# Patient Record
Sex: Male | Born: 1970 | Race: Black or African American | Hispanic: No | Smoking: Current every day smoker
Health system: Southern US, Community
[De-identification: ages and names within clinical notes are randomized; demographics above are authoritative.]

## PROBLEM LIST (undated history)

## (undated) DIAGNOSIS — E7800 Pure hypercholesterolemia, unspecified: Secondary | ICD-10-CM

## (undated) DIAGNOSIS — E119 Type 2 diabetes mellitus without complications: Secondary | ICD-10-CM

## (undated) DIAGNOSIS — I10 Essential (primary) hypertension: Secondary | ICD-10-CM

---

## 2007-09-29 ENCOUNTER — Emergency Department (HOSPITAL_COMMUNITY): Admission: EM | Admit: 2007-09-29 | Discharge: 2007-09-30 | Payer: Self-pay | Admitting: Emergency Medicine

## 2011-04-10 LAB — GC/CHLAMYDIA PROBE AMP, GENITAL: GC Probe Amp, Genital: NEGATIVE

## 2014-11-09 ENCOUNTER — Emergency Department (HOSPITAL_COMMUNITY): Payer: 59

## 2014-11-09 ENCOUNTER — Encounter (HOSPITAL_COMMUNITY): Payer: Self-pay | Admitting: *Deleted

## 2014-11-09 ENCOUNTER — Emergency Department (HOSPITAL_COMMUNITY)
Admission: EM | Admit: 2014-11-09 | Discharge: 2014-11-09 | Disposition: A | Discharge status: Home / Self Care | Payer: 59 | Attending: Emergency Medicine | Admitting: Emergency Medicine

## 2014-11-09 DIAGNOSIS — E119 Type 2 diabetes mellitus without complications: Secondary | ICD-10-CM | POA: Diagnosis not present

## 2014-11-09 DIAGNOSIS — Z79899 Other long term (current) drug therapy: Secondary | ICD-10-CM | POA: Insufficient documentation

## 2014-11-09 DIAGNOSIS — R0789 Other chest pain: Secondary | ICD-10-CM

## 2014-11-09 DIAGNOSIS — Z7982 Long term (current) use of aspirin: Secondary | ICD-10-CM | POA: Diagnosis not present

## 2014-11-09 DIAGNOSIS — M79622 Pain in left upper arm: Secondary | ICD-10-CM | POA: Insufficient documentation

## 2014-11-09 DIAGNOSIS — I10 Essential (primary) hypertension: Secondary | ICD-10-CM | POA: Diagnosis not present

## 2014-11-09 DIAGNOSIS — Z720 Tobacco use: Secondary | ICD-10-CM | POA: Diagnosis not present

## 2014-11-09 DIAGNOSIS — R079 Chest pain, unspecified: Secondary | ICD-10-CM | POA: Diagnosis present

## 2014-11-09 HISTORY — DX: Essential (primary) hypertension: I10

## 2014-11-09 HISTORY — DX: Pure hypercholesterolemia, unspecified: E78.00

## 2014-11-09 HISTORY — DX: Type 2 diabetes mellitus without complications: E11.9

## 2014-11-09 LAB — BASIC METABOLIC PANEL
Anion gap: 8 (ref 5–15)
BUN: 16 mg/dL (ref 6–23)
CHLORIDE: 104 mmol/L (ref 96–112)
CO2: 25 mmol/L (ref 19–32)
CREATININE: 0.98 mg/dL (ref 0.50–1.35)
Calcium: 9.2 mg/dL (ref 8.4–10.5)
Glucose, Bld: 102 mg/dL — ABNORMAL HIGH (ref 70–99)
POTASSIUM: 3.8 mmol/L (ref 3.5–5.1)
SODIUM: 137 mmol/L (ref 135–145)

## 2014-11-09 LAB — CBC
HCT: 43.5 % (ref 39.0–52.0)
HEMOGLOBIN: 14.7 g/dL (ref 13.0–17.0)
MCH: 27.3 pg (ref 26.0–34.0)
MCHC: 33.8 g/dL (ref 30.0–36.0)
MCV: 80.9 fL (ref 78.0–100.0)
PLATELETS: 183 10*3/uL (ref 150–400)
RBC: 5.38 MIL/uL (ref 4.22–5.81)
RDW: 13.4 % (ref 11.5–15.5)
WBC: 3.9 10*3/uL — ABNORMAL LOW (ref 4.0–10.5)

## 2014-11-09 LAB — I-STAT TROPONIN, ED
TROPONIN I, POC: 0 ng/mL (ref 0.00–0.08)
Troponin i, poc: 0 ng/mL (ref 0.00–0.08)

## 2014-11-09 NOTE — ED Notes (Signed)
Pt has multiple complaints. Reports onset yesterday while eating, "had funny sensation go over his body." specifically had neck and chest pains, difficulty swallowing, abd "twitching", palpitations. No acute distress noted at triage.

## 2014-11-09 NOTE — ED Provider Notes (Signed)
CSN: [id number]     Arrival date & time 11/09/14  1210 History   First MD Initiated Contact with Patient 11/09/14 1636     Chief Complaint  Patient presents with  . Palpitations  . Chest Pain  . Headache     (Consider location/radiation/quality/duration/timing/severity/associated sxs/prior Treatment) HPI   44 year old male with history of non-insulin-dependent diabetes, hypertension, hypercholesterolemia who presents with planes of left chest pressure. Patient reports within the past year he has multiple episodes of pressure sensation radiates across his left chest up towards his neck and across his chest. Symptoms usually lasting 10-15 minutes, resolved without any specific treatment. He also reported mild headache and a funny sensation across his head along with heart palpitation during these episodes. He denies having pain, chest pressure and tightness. These episodes are nonexertional in nature. There is no associated fever, chills, productive cough, lightheadedness, dizziness, shortness of breath, hemoptysis, back pain, numbness or weakness. Last episode was last night while sitting. He reported being seen for this complaint last year which he had a negative stress test. He was also been evaluated by his PCP last month for the same complaint. At that time he was taking a statin medication and his doctor stopped his medication for a month. At this time he denies having any significant discomfort. Patient does work in [employer] but denies any recent heavy lifting or any recent trauma. No other specific complaint. He is a smoker and does have family history of cardiac disease.  Past Medical History  Diagnosis Date  . High cholesterol   . Hypertension   . Diabetes mellitus without complication    History reviewed. No pertinent past surgical history. History reviewed. No pertinent family history. History  Substance Use Topics  . Smoking status: Current Every Day Smoker    Types:  Cigarettes  . Smokeless tobacco: Not on file  . Alcohol Use: No    Review of Systems  All other systems reviewed and are negative.     Allergies  Review of patient's allergies indicates no known allergies.  Home Medications   Prior to Admission medications   Medication Sig Start Date End Date Taking? Authorizing Provider  aspirin EC 81 MG tablet Take 81 mg by mouth daily.   Yes Historical Provider, MD  ibuprofen (ADVIL,MOTRIN) 200 MG tablet Take 400 mg by mouth every 6 (six) hours as needed for headache or mild pain.   Yes Historical Provider, MD  losartan (COZAAR) 50 MG tablet Take 50 mg by mouth daily.   Yes Historical Provider, MD  metFORMIN (GLUCOPHAGE) 1000 MG tablet Take 1,000 mg by mouth 2 (two) times daily with a meal.   Yes Historical Provider, MD  metoprolol tartrate (LOPRESSOR) 25 MG tablet Take 25 mg by mouth 2 (two) times daily.   Yes Historical Provider, MD   BP 136/92 mmHg  Pulse 61  Temp(Src) 97.9 F (36.6 C) (Oral)  Resp 16  Ht 5\' 9"  (1.753 m)  Wt 235 lb 14.4 oz (107.004 kg)  BMI 34.82 kg/m2  SpO2 99% Physical Exam  Constitutional: He is oriented to person, place, and time. He appears well-developed and well-nourished. No distress.  HENT:  Head: Atraumatic.  Eyes: Conjunctivae are normal.  Neck: Normal range of motion. Neck supple. No JVD present.  No cervical midline spine tenderness crepitus or step-off.  Cardiovascular: Normal rate, regular rhythm and intact distal pulses.  Exam reveals no gallop and no friction rub.   No murmur heard. Pulmonary/Chest: Effort normal and breath sounds  normal. No respiratory distress. He has no wheezes. He exhibits no tenderness.  Abdominal: Soft. There is no tenderness.  Musculoskeletal: He exhibits no edema.  5 out 5 strength to bilateral upper extremities with normal pulses and normal sensation.  Neurological: He is alert and oriented to person, place, and time.  Skin: No rash noted.  Psychiatric: He has a normal  mood and affect.  Nursing note and vitals reviewed.   ED Course  Procedures (including critical care time)  5:02 PM Patient here with intermittent left arm discomfort. His HEART scores of 2 which puts him at low risk for MACE.  Low suspicion for rhabdomyolysis given that he is no longer taking statin medication. He reported having a negative stress test last year.    6:10 PM Delta troponin is unremarkable. Patient is symptom-free. He'll need to follow-up with his PCP for further management. He would likely need to be started on a new cholesterol medication. Return precautions discussed. All questions answered to patient's satisfaction.  Labs Review Labs Reviewed  CBC - Abnormal; Notable for the following:    WBC 3.9 (*)    All other components within normal limits  BASIC METABOLIC PANEL - Abnormal; Notable for the following:    Glucose, Bld 102 (*)    All other components within normal limits  I-STAT TROPOININ, ED  Rosezena Sensor, ED    Imaging Review Dg Chest 2 View  11/09/2014   CLINICAL DATA:  Intermittent left-sided chest tightness and palpitations, worsening for 1 day.  EXAM: CHEST  2 VIEW  COMPARISON:  None.  FINDINGS: Normal heart size and mediastinal contours. No acute infiltrate or edema. No effusion or pneumothorax. No acute osseous findings.  IMPRESSION: Negative chest.   Electronically Signed   By: Marnee Spring M.D.   On: 11/09/2014 13:53     EKG Interpretation None      Date: 11/09/2014  Rate: 77  Rhythm: normal sinus rhythm  QRS Axis: rightward axis  Intervals: normal  ST/T Wave abnormalities: normal  Conduction Disutrbances: none  Narrative Interpretation:   Old EKG Reviewed: None for comparison     MDM   Final diagnoses:  Left upper arm pain  Chest tightness or pressure    BP 136/84 mmHg  Pulse 64  Temp(Src) 98.2 F (36.8 C) (Oral)  Resp 16  Ht  (1.753 m)  Wt 235 lb 14.4 oz (107.004 kg)  BMI 34.82 kg/m2  SpO2 96%  I have  reviewed nursing notes and vital signs. I personally reviewed the imaging tests through PACS system  I reviewed available ER/hospitalization records thought the EMR     Fayrene Helper, PA-C 11/09/14 1814  Mancel Bale, MD 11/12/14 [phone]

## 2014-11-09 NOTE — Discharge Instructions (Signed)
Please follow up closely with your doctor for further management of your chest discomfort and left arm pain.  You also will need to have close evaluation of your cholesterol level.   Chest Pain (Nonspecific) It is often hard to give a specific diagnosis for the cause of chest pain. There is always a chance that your pain could be related to something serious, such as a heart attack or a blood clot in the lungs. You need to follow up with your health care provider for further evaluation. CAUSES   Heartburn.  Pneumonia or bronchitis.  Anxiety or stress.  Inflammation around your heart (pericarditis) or lung (pleuritis or pleurisy).  A blood clot in the lung.  A collapsed lung (pneumothorax). It can develop suddenly on its own (spontaneous pneumothorax) or from trauma to the chest.  Shingles infection (herpes zoster virus). The chest wall is composed of bones, muscles, and cartilage. Any of these can be the source of the pain.  The bones can be bruised by injury.  The muscles or cartilage can be strained by coughing or overwork.  The cartilage can be affected by inflammation and become sore (costochondritis). DIAGNOSIS  Lab tests or other studies may be needed to find the cause of your pain. Your health care provider may have you take a test called an ambulatory electrocardiogram (ECG). An ECG records your heartbeat patterns over a 24-hour period. You may also have other tests, such as:  Transthoracic echocardiogram (TTE). During echocardiography, sound waves are used to evaluate how blood flows through your heart.  Transesophageal echocardiogram (TEE).  Cardiac monitoring. This allows your health care provider to monitor your heart rate and rhythm in real time.  Holter monitor. This is a portable device that records your heartbeat and can help diagnose heart arrhythmias. It allows your health care provider to track your heart activity for several days, if needed.  Stress tests by  exercise or by giving medicine that makes the heart beat faster. TREATMENT   Treatment depends on what may be causing your chest pain. Treatment may include:  Acid blockers for heartburn.  Anti-inflammatory medicine.  Pain medicine for inflammatory conditions.  Antibiotics if an infection is present.  You may be advised to change lifestyle habits. This includes stopping smoking and avoiding alcohol, caffeine, and chocolate.  You may be advised to keep your head raised (elevated) when sleeping. This reduces the chance of acid going backward from your stomach into your esophagus. Most of the time, nonspecific chest pain will improve within 2-3 days with rest and mild pain medicine.  HOME CARE INSTRUCTIONS   If antibiotics were prescribed, take them as directed. Finish them even if you start to feel better.  For the next few days, avoid physical activities that bring on chest pain. Continue physical activities as directed.  Do not use any tobacco products, including cigarettes, chewing tobacco, or electronic cigarettes.  Avoid drinking alcohol.  Only take medicine as directed by your health care provider.  Follow your health care provider's suggestions for further testing if your chest pain does not go away.  Keep any follow-up appointments you made. If you do not go to an appointment, you could develop lasting (chronic) problems with pain. If there is any problem keeping an appointment, call to reschedule. SEEK MEDICAL CARE IF:   Your chest pain does not go away, even after treatment.  You have a rash with blisters on your chest.  You have a fever. SEEK IMMEDIATE MEDICAL CARE IF:  You have increased chest pain or pain that spreads to your arm, neck, jaw, back, or abdomen.  You have shortness of breath.  You have an increasing cough, or you cough up blood.  You have severe back or abdominal pain.  You feel nauseous or vomit.  You have severe weakness.  You  faint.  You have chills. This is an emergency. Do not wait to see if the pain will go away. Get medical help at once. Call your local emergency services (911 in [place] Do not drive yourself to the hospital. MAKE SURE YOU:   Understand these instructions.  Will watch your condition.  Will get help right away if you are not doing well or get worse. Document Released: 04/12/2005 Document Revised: 07/08/2013 Document Reviewed: 02/06/2008 [facility] Patient Information 2015 [place], [place]. This information is not intended to replace advice given to you by your health care provider. Make sure you discuss any questions you have with your health care provider.

## 2016-03-26 ENCOUNTER — Ambulatory Visit (HOSPITAL_COMMUNITY)
Admission: EM | Admit: 2016-03-26 | Discharge: 2016-03-26 | Disposition: A | Discharge status: Home / Self Care | Payer: Managed Care, Other (non HMO) | Attending: Family Medicine | Admitting: Family Medicine

## 2016-03-26 ENCOUNTER — Encounter (HOSPITAL_COMMUNITY): Payer: Self-pay | Admitting: Emergency Medicine

## 2016-03-26 DIAGNOSIS — Z202 Contact with and (suspected) exposure to infections with a predominantly sexual mode of transmission: Secondary | ICD-10-CM

## 2016-03-26 MED ORDER — LIDOCAINE HCL (PF) 1 % IJ SOLN
INTRAMUSCULAR | Status: AC
  Filled 2016-03-26: qty 2

## 2016-03-26 MED ORDER — AZITHROMYCIN 250 MG PO TABS
ORAL_TABLET | ORAL | Status: AC
  Filled 2016-03-26: qty 4

## 2016-03-26 MED ORDER — CEFTRIAXONE SODIUM 1 G IJ SOLR
1.0000 g | Freq: Once | INTRAMUSCULAR | Status: AC
  Administered 2016-03-26: 1 g via INTRAMUSCULAR

## 2016-03-26 MED ORDER — CEFTRIAXONE SODIUM 1 G IJ SOLR
INTRAMUSCULAR | Status: AC
  Filled 2016-03-26: qty 10

## 2016-03-26 MED ORDER — AZITHROMYCIN 250 MG PO TABS
1000.0000 mg | ORAL_TABLET | Freq: Once | ORAL | Status: AC
  Administered 2016-03-26: 1000 mg via ORAL

## 2016-03-26 NOTE — ED Provider Notes (Signed)
[facility]    CSN: [id number] Arrival date & time: 03/26/16  1644  First Provider Contact:  First MD Initiated Contact with Patient 03/26/16 1906        History   Chief Complaint Chief Complaint  Patient presents with  . SEXUALLY TRANSMITTED DISEASE    HPI Gerilyn NestleJames R Walthour is a 45 y.o. male.   HPI Patient states that 19 March 2016 he was seen at [facility] and treated for an STD with rocephin and azithromycin and that evening he exposed himself with another partner.  Has been c/o dysuria, and some penile discharge.  Denies fever, chills, abd pain, groin pain.    Past Medical History:  Diagnosis Date  . Diabetes mellitus without complication (HCC)   . High cholesterol   . Hypertension     There are no active problems to display for this patient.   History reviewed. No pertinent surgical history.     Home Medications    Prior to Admission medications   Medication Sig Start Date End Date Taking? Authorizing Provider  aspirin EC 81 MG tablet Take 81 mg by mouth daily.    Historical Provider, MD  ibuprofen (ADVIL,MOTRIN) 200 MG tablet Take 400 mg by mouth every 6 (six) hours as needed for headache or mild pain.    Historical Provider, MD  losartan (COZAAR) 50 MG tablet Take 50 mg by mouth daily.    Historical Provider, MD  metFORMIN (GLUCOPHAGE) 1000 MG tablet Take 1,000 mg by mouth 2 (two) times daily with a meal.    Historical Provider, MD  metoprolol tartrate (LOPRESSOR) 25 MG tablet Take 25 mg by mouth 2 (two) times daily.    Historical Provider, MD    Family History History reviewed. No pertinent family history.  Social History Social History  Substance Use Topics  . Smoking status: Current Every Day Smoker    Types: Cigarettes  . Smokeless tobacco: Never Used  . Alcohol use No     Allergies   Review of patient's allergies indicates no known allergies.   Review of Systems Review of Systems  Constitutional: Negative.   HENT: Negative.     Respiratory: Negative.   Gastrointestinal: Negative.   Genitourinary: Positive for discharge and dysuria. Negative for flank pain, genital sores, penile pain, penile swelling, scrotal swelling and testicular pain.  Musculoskeletal: Negative.   Skin: Negative.   Neurological: Negative.   Psychiatric/Behavioral: Negative.      Physical Exam Triage Vital Signs ED Triage Vitals  Enc Vitals Group     BP 03/26/16 1844 142/92     Pulse Rate 03/26/16 1844 80     Resp 03/26/16 1844 20     Temp 03/26/16 1844 97.8 F (36.6 C)     Temp Source 03/26/16 1844 Oral     SpO2 03/26/16 1844 100 %     Weight --      Height --      Head Circumference --      Peak Flow --      Pain Score 03/26/16 1847 0     Pain Loc --      Pain Edu? --      Excl. in GC? --    No data found.   Updated Vital Signs BP 142/92 (BP Location: Left Arm)   Pulse 80   Temp 97.8 F (36.6 C) (Oral)   Resp 20   SpO2 100%   Visual Acuity Right Eye Distance:   Left Eye Distance:  Bilateral Distance:    Right Eye Near:   Left Eye Near:    Bilateral Near:     Physical Exam  Constitutional: He is oriented to person, place, and time. No distress.  HENT:  Head: Normocephalic and atraumatic.  Eyes: EOM are normal. Pupils are equal, round, and reactive to light.  Neck: Normal range of motion.  Cardiovascular: Normal rate and regular rhythm.   Pulmonary/Chest: Effort normal and breath sounds normal. No respiratory distress.  Abdominal: Soft. He exhibits no distension. There is no tenderness.  Musculoskeletal: Normal range of motion.  Neurological: He is alert and oriented to person, place, and time.  Skin: Skin is warm and dry.  Psychiatric: He has a normal mood and affect.     UC Treatments / Results  Labs (all labs ordered are listed, but only abnormal results are displayed) Labs Reviewed  URINE CYTOLOGY ANCILLARY ONLY    EKG  EKG Interpretation None       Radiology No results  found.  Procedures Procedures (including critical care time)  Medications Ordered in UC Medications  cefTRIAXone (ROCEPHIN) injection 1 g (not administered)  azithromycin (ZITHROMAX) tablet 1,000 mg (not administered)     Initial Impression / Assessment and Plan / UC Course  I have reviewed the triage vital signs and the nursing notes.  Pertinent labs & imaging results that were available during my care of the patient were reviewed by me and considered in my medical decision making (see chart for details).  Clinical Course      Final Clinical Impressions(s) / UC Diagnoses   Final diagnoses:  Exposure to STD    New Prescriptions New Prescriptions   No medications on file  patient declined to have RPR and HIV testing today.  States that this was recently done at [facility].  Advised that this should be done since he is coming in today for treatment.  Still he declined. Discussed safe sex practices.  Can return to the clinic in 5-7 days if still symptomatic.  Should refrain from having sexual intercourse until his symptoms have completely resolved and partner has also been treated and asymptomatic. All questions answered.    Naida Sleight, PA-C 03/26/16 [phone]

## 2016-03-26 NOTE — ED Triage Notes (Signed)
[facility]. The patient reported that on 03/21/2016 [facility] Rocephin Injection and 100 mg of Zithromax. The patient reported having unprotected sex with an infected partner the same evening and is now concerned about the need for re-treatment.

## 2016-03-28 LAB — URINE CYTOLOGY ANCILLARY ONLY
Chlamydia: NEGATIVE
Neisseria Gonorrhea: NEGATIVE
Trichomonas: NEGATIVE

## 2016-09-04 IMAGING — DX DG CHEST 2V
2 series · 2 of 2 positions shown · non-contrast
Comparison: None.

CLINICAL DATA: Intermittent left-sided chest tightness and
palpitations, worsening for 1 day.

EXAM:
CHEST  2 VIEW

[chest pa]
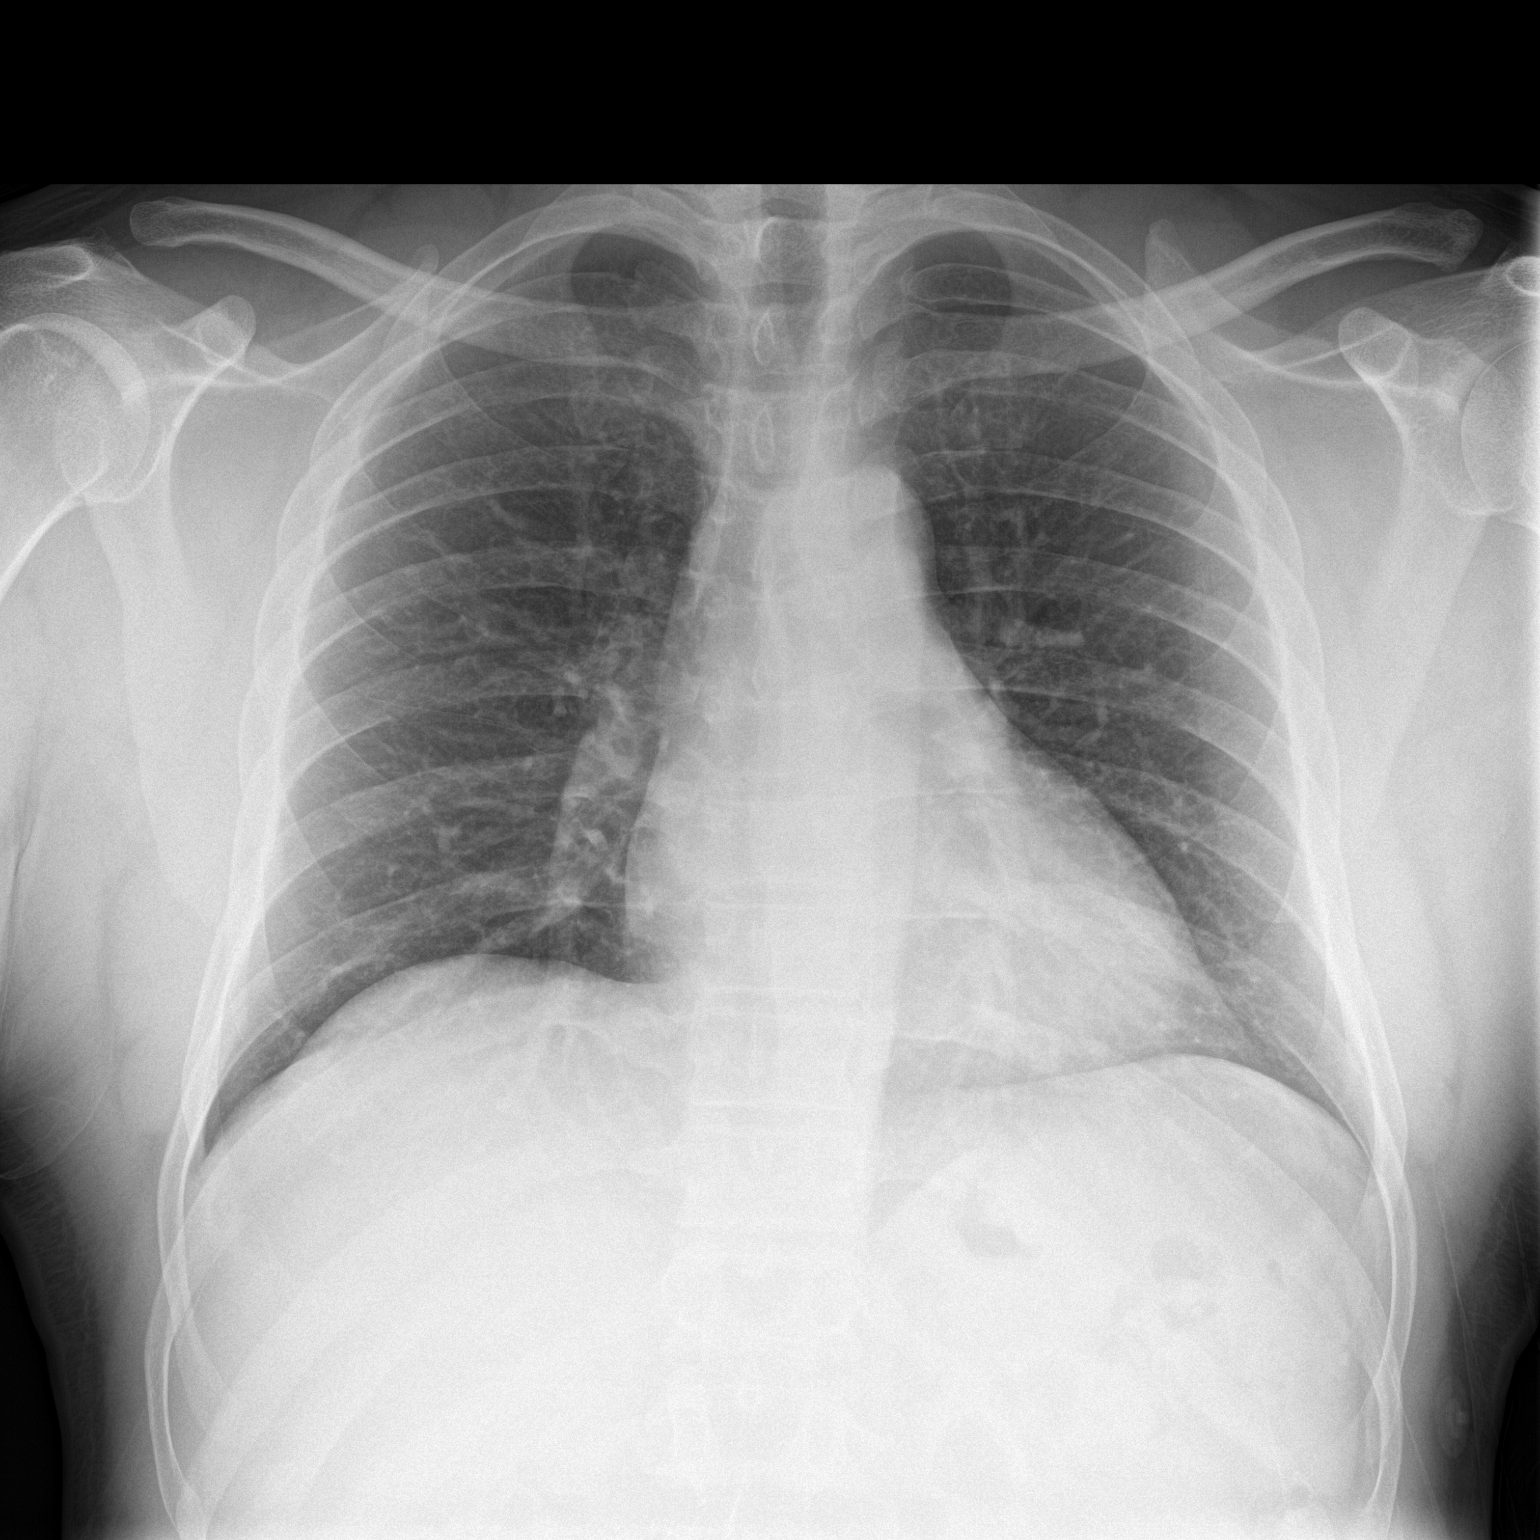

[chest lat]
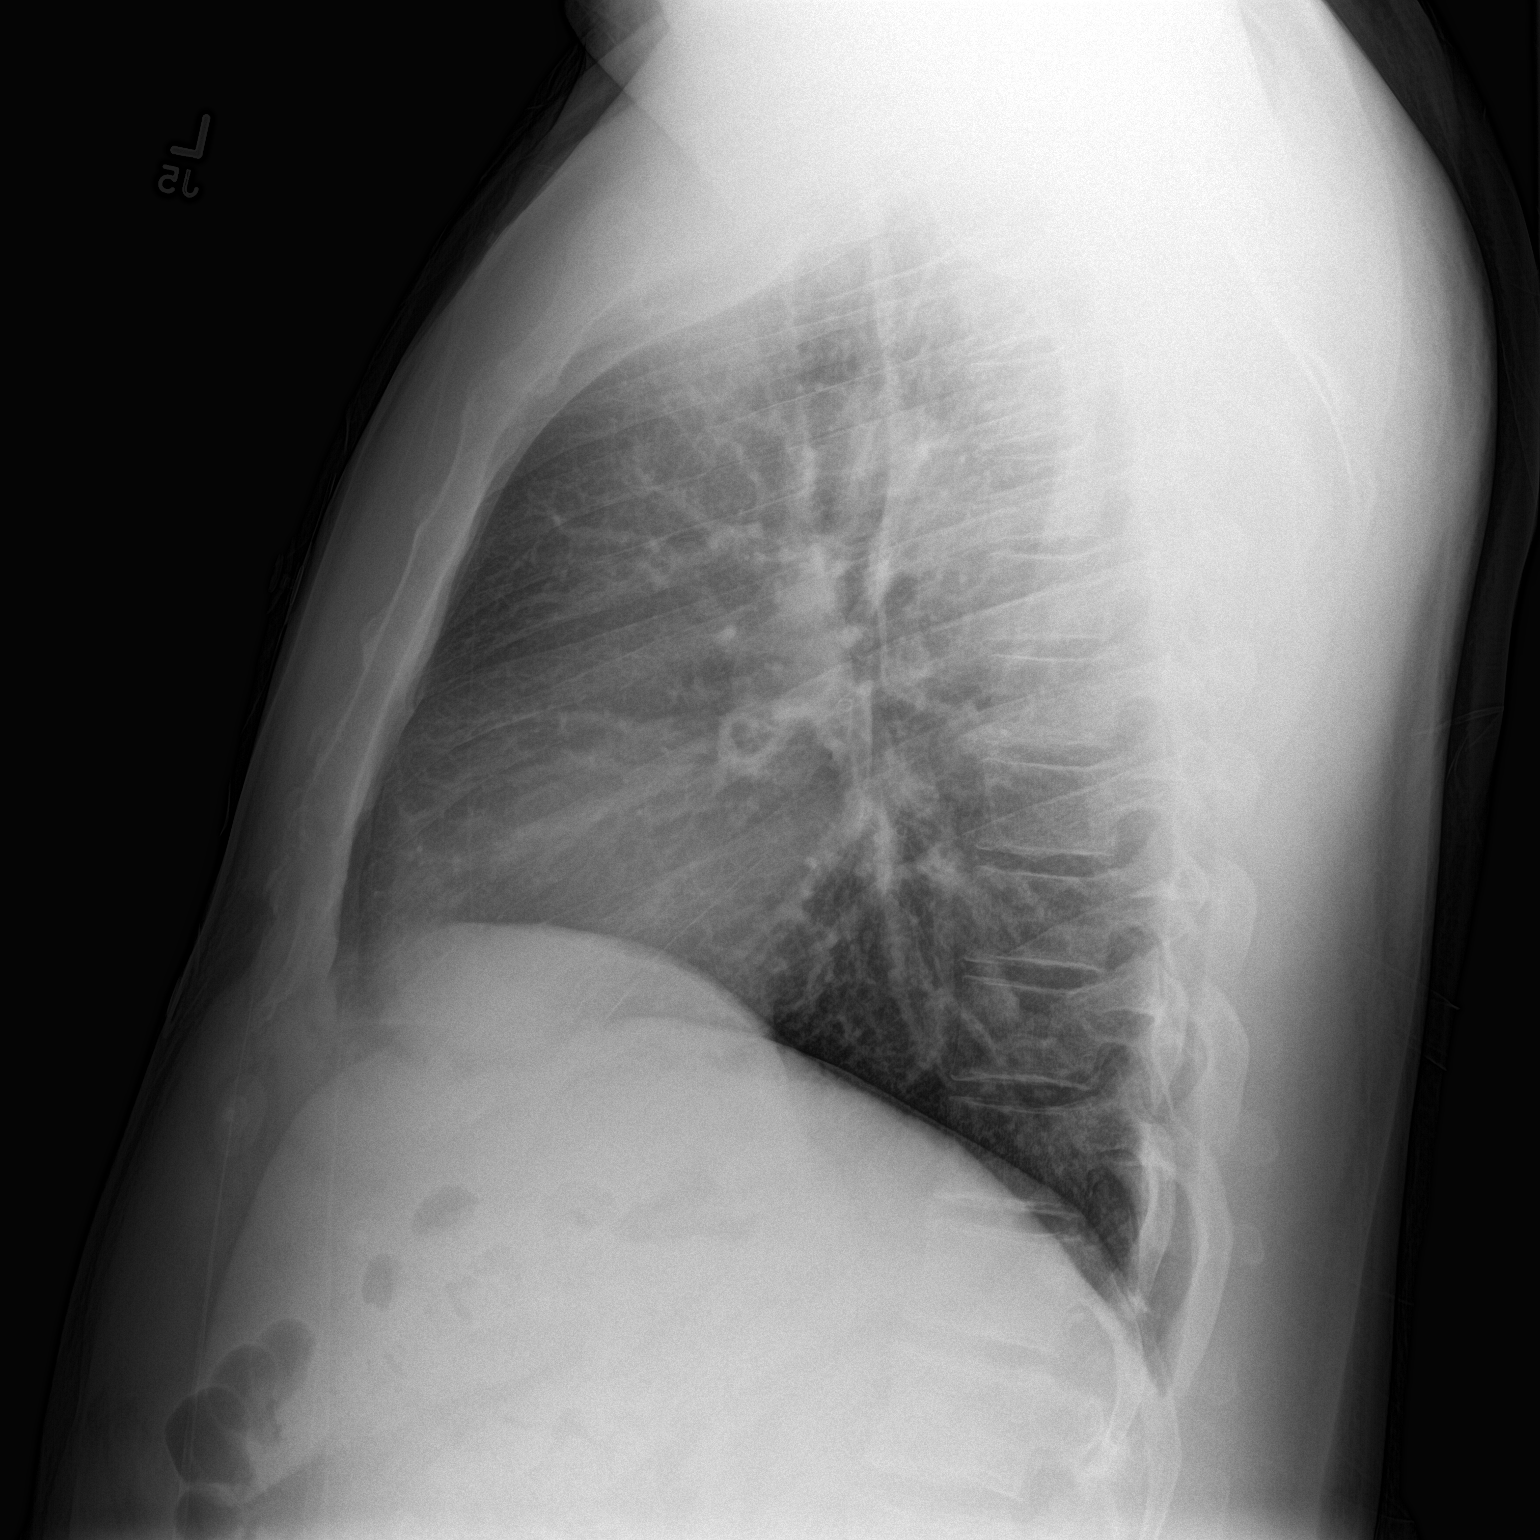

[2 of 2 positions shown; findings below may reference images not displayed]

FINDINGS: Normal heart size and mediastinal contours. No acute infiltrate or
edema. No effusion or pneumothorax. No acute osseous findings.
IMPRESSION: Negative chest.
